# Patient Record
Sex: Male | Born: 1990 | Race: White | Hispanic: No | Marital: Single | Smoking: Never smoker
Health system: Southern US, Community
[De-identification: ages and names within clinical notes are randomized; demographics above are authoritative.]

---

## 2015-07-26 ENCOUNTER — Emergency Department (INDEPENDENT_AMBULATORY_CARE_PROVIDER_SITE_OTHER)
Admission: EM | Admit: 2015-07-26 | Discharge: 2015-07-26 | Disposition: A | Discharge status: Home / Self Care | Payer: BLUE CROSS/BLUE SHIELD | Source: Home / Self Care | Attending: Family Medicine | Admitting: Family Medicine

## 2015-07-26 ENCOUNTER — Encounter: Payer: Self-pay | Admitting: Emergency Medicine

## 2015-07-26 DIAGNOSIS — J069 Acute upper respiratory infection, unspecified: Secondary | ICD-10-CM

## 2015-07-26 DIAGNOSIS — B9789 Other viral agents as the cause of diseases classified elsewhere: Principal | ICD-10-CM

## 2015-07-26 HISTORY — DX: Wilson's disease: E83.01

## 2015-07-26 MED ORDER — PREDNISONE 20 MG PO TABS: 20.0000 mg | ORAL_TABLET | Freq: Two times a day (BID) | ORAL | Status: DC

## 2015-07-26 MED ORDER — AZITHROMYCIN 250 MG PO TABS
ORAL_TABLET | ORAL | Status: DC
Start: 2015-07-26 — End: 2015-08-12

## 2015-07-26 MED ORDER — GUAIFENESIN-CODEINE 100-10 MG/5ML PO SOLN: ORAL | Status: DC

## 2015-07-26 NOTE — ED Notes (Signed)
Patient reports congestion and cough for about a week; wonders if sinus infection since both parents have this. Took Da-quil this morning.

## 2015-07-26 NOTE — Discharge Instructions (Signed)
Take plain guaifenesin (1200mg extended release tabs such as Mucinex) twice daily, with plenty of water, for cough and congestion.   Get adequate rest.   °May use Afrin nasal spray (or generic oxymetazoline) twice daily for about 5 days and then discontinue.  Also recommend using saline nasal spray several times daily and saline nasal irrigation (AYR is a common brand).  Use Flonase nasal spray each morning after using Afrin nasal spray and saline nasal irrigation. °Try warm salt water gargles for sore throat.  °Stop all antihistamines for now, and other non-prescription cough/cold preparations. °Follow-up with family doctor if not improving about 7 to 10 days.  °

## 2015-07-26 NOTE — ED Provider Notes (Signed)
CSN: [id number]     Arrival date & time 07/26/15  1631 History   First MD Initiated Contact with Patient 07/26/15 1710     Chief Complaint  Patient presents with  . Nasal Congestion  . Cough      HPI Comments: About 9 days ago patient developed typical cold-like symptoms developing over several days,  including mild sore throat, sinus congestion, fatigue, and cough.  He states that his non-productive cough has persisted, and he often coughs until he gags.  His cough is worse at night.   Family history of asthma in his mother.   Past Medical History  Diagnosis Date  . 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 No pertinent past surgical history. History reviewed. No pertinent family history. Social History  Substance Use Topics  . Smoking status: Never Smoker   . Smokeless tobacco: None  . Alcohol Use: No    Review of Systems + sore throat + cough No pleuritic pain No wheezing + nasal congestion + post-nasal drainage No sinus pain/pressure No itchy/red eyes No earache No hemoptysis + SOB with activity No fever, + chills/sweats No nausea No vomiting No abdominal pain No diarrhea No urinary symptoms No skin rash + fatigue No myalgias No headache Used OTC meds without relief  Allergies  Review of patient's allergies indicates no known allergies.  Home Medications   Prior to Admission medications   Medication Sig Start Date End Date Taking? Authorizing Provider  amphetamine-dextroamphetamine (ADDERALL XR) 30 MG 24 hr capsule Take 30 mg by mouth daily.   Yes Historical Provider, MD  amphetamine-dextroamphetamine (ADDERALL) 10 MG tablet Take 10 mg by mouth 3 (three) times daily.   Yes Historical Provider, MD  azithromycin (ZITHROMAX Z-PAK) 250 MG tablet Take 2 tabs today; then begin one tab once daily for 4 more days. 07/26/15   Lattie HawStephen A Vikrant Pryce, MD  guaiFENesin-codeine 100-10 MG/5ML syrup Take 10mL by mouth at bedtime as needed for cough 07/26/15    Lattie HawStephen A Priyah Schmuck, MD  predniSONE (DELTASONE) 20 MG tablet Take 1 tablet (20 mg total) by mouth 2 (two) times daily. Take with food. 07/26/15   Lattie HawStephen A Brooklinn Longbottom, MD   Meds Ordered and Administered this Visit  Medications - No data to display  BP 116/79 mmHg  Pulse 98  Temp(Src) 98.1 F (36.7 C) (Oral)  Resp 16  Ht 5' 10.5" (1.791 m)  Wt 240 lb (108.863 kg)  BMI 33.94 kg/m2  SpO2 98% No data found.   Physical Exam Nursing notes and Vital Signs reviewed. Appearance:  Patient appears stated age, and in no acute distress.  Patient is obese (BMI 33.9) Eyes:  Pupils are equal, round, and reactive to light and accomodation.  Extraocular movement is intact.  Conjunctivae are not inflamed  Ears:  Canals normal.  Tympanic membranes normal.  Nose:  Mildly congested turbinates.  No sinus tenderness.   Pharynx:  Normal Neck:  Supple.  Tender enlarged posterior/lateral nodes are palpated bilaterally  Lungs:  Clear to auscultation.  Breath sounds are equal.  Moving air well. Heart:  Regular rate and rhythm without murmurs, rubs, or gallops.  Abdomen:  Nontender without masses or hepatosplenomegaly.  Bowel sounds are present.  No CVA or flank tenderness.  Extremities:  No edema.  Skin:  No rash present.   ED Course  Procedures none  MDM   1. Viral URI with cough     Begin Z-pak for atypical coverage, and prednisone burst. Rx  for Robitussin AC for night time cough.  Take plain guaifenesin (1200mg  extended release tabs such as Mucinex) twice daily, with plenty of water, for cough and congestion.   Get adequate rest.   May use Afrin nasal spray (or generic oxymetazoline) twice daily for about 5 days and then discontinue.  Also recommend using saline nasal spray several times daily and saline nasal irrigation (AYR is a common brand).  Use Flonase nasal spray each morning after using Afrin nasal spray and saline nasal irrigation. Try warm salt water gargles for sore throat.  Stop all antihistamines  for now, and other non-prescription cough/cold preparations.   Follow-up with family doctor if not improving about 7 to10 days.     Lattie HawStephen A Jourdon Zimmerle, MD 08/02/15 [phone]

## 2015-08-12 ENCOUNTER — Emergency Department (INDEPENDENT_AMBULATORY_CARE_PROVIDER_SITE_OTHER): Payer: BLUE CROSS/BLUE SHIELD

## 2015-08-12 ENCOUNTER — Emergency Department (INDEPENDENT_AMBULATORY_CARE_PROVIDER_SITE_OTHER)
Admission: EM | Admit: 2015-08-12 | Discharge: 2015-08-12 | Disposition: A | Discharge status: Home / Self Care | Payer: BLUE CROSS/BLUE SHIELD | Source: Home / Self Care | Attending: Family Medicine | Admitting: Family Medicine

## 2015-08-12 ENCOUNTER — Encounter: Payer: Self-pay | Admitting: *Deleted

## 2015-08-12 DIAGNOSIS — R0789 Other chest pain: Secondary | ICD-10-CM

## 2015-08-12 DIAGNOSIS — R0989 Other specified symptoms and signs involving the circulatory and respiratory systems: Secondary | ICD-10-CM | POA: Diagnosis not present

## 2015-08-12 DIAGNOSIS — R05 Cough: Secondary | ICD-10-CM

## 2015-08-12 DIAGNOSIS — R053 Chronic cough: Secondary | ICD-10-CM

## 2015-08-12 LAB — POCT CBC W AUTO DIFF (K'VILLE URGENT CARE)

## 2015-08-12 MED ORDER — GUAIFENESIN-CODEINE 100-10 MG/5ML PO SOLN: ORAL | Status: DC

## 2015-08-12 MED ORDER — BECLOMETHASONE DIPROPIONATE 40 MCG/ACT IN AERS: 2.0000 | INHALATION_SPRAY | Freq: Two times a day (BID) | RESPIRATORY_TRACT | Status: AC

## 2015-08-12 MED ORDER — CEFDINIR 300 MG PO CAPS: 300.0000 mg | ORAL_CAPSULE | Freq: Two times a day (BID) | ORAL | Status: DC

## 2015-08-12 MED ORDER — PREDNISONE 20 MG PO TABS: ORAL_TABLET | ORAL | Status: DC

## 2015-08-12 MED ORDER — FLUTICASONE PROPIONATE 50 MCG/ACT NA SUSP
NASAL | Status: AC
Start: 2015-08-12 — End: ?

## 2015-08-12 NOTE — ED Provider Notes (Signed)
CSN: [id number]     Arrival date & time 08/12/15  1730 History   First MD Initiated Contact with Patient 08/12/15 1800     Chief Complaint  Patient presents with  . Cough      HPI Comments: Patient was treated for a URI 2.5 weeks ago.  He states that he improved initially while taking prednisone, but his cough recurred after finishing prednisone and azithromycin.  His cough is non-productive and awakens him at night.  No shortness of breath or pleuritic pain.  During the past several days he has had morning sweats but no fever. He states that both his parents have asthma.  The history is provided by the patient.    Past Medical History  Diagnosis Date  . 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 No pertinent past surgical history. Family History  Problem Relation Age of Onset  . Wilson's disease Brother    Social History  Substance Use Topics  . Smoking status: Never Smoker   . Smokeless tobacco: None  . Alcohol Use: No    Review of Systems No sore throat + cough No pleuritic pain No wheezing + nasal congestion + post-nasal drainage No sinus pain/pressure No itchy/red eyes No earache No hemoptysis No SOB No fever, + sweats No nausea No vomiting No abdominal pain No diarrhea No urinary symptoms No skin rash + fatigue No myalgias No headache    Allergies  Review of patient's allergies indicates no known allergies.  Home Medications   Prior to Admission medications   Medication Sig Start Date End Date Taking? Authorizing Provider  amphetamine-dextroamphetamine (ADDERALL XR) 30 MG 24 hr capsule Take 30 mg by mouth daily.    Historical Provider, MD  amphetamine-dextroamphetamine (ADDERALL) 10 MG tablet Take 10 mg by mouth 3 (three) times daily.    Historical Provider, MD   Meds Ordered and Administered this Visit  Medications - No data to display  BP 148/87 mmHg  Pulse 100  Temp(Src) 98.1 F (36.7 C) (Oral)  Resp 18  Ht   (1.778 m)  Wt 240 lb (108.863 kg)  BMI 34.44 kg/m2  SpO2 98% No data found.   Physical Exam Nursing notes and Vital Signs reviewed. Appearance:  Patient appears stated age, and in no acute distress Eyes:  Pupils are equal, round, and reactive to light and accomodation.  Extraocular movement is intact.  Conjunctivae are not inflamed  Ears:  Canals normal.  Tympanic membranes normal.  Nose:  Congested turbinates.  No sinus tenderness.   Pharynx:  Normal Neck:  Supple.  Tender enlarged posterior/lateral nodes are palpated bilaterally  Lungs:  Clear to auscultation.  Breath sounds are equal.  Moving air well. Heart:  Regular rate and rhythm without murmurs, rubs, or gallops.  Abdomen:  Nontender without masses or hepatosplenomegaly.  Bowel sounds are present.  No CVA or flank tenderness.  Extremities:  No edema.  Skin:  No rash present.   ED Course  Procedures none    Labs Reviewed  POCT CBC W AUTO DIFF ([facility]):  WBC 7.2; LY 37.2; MO 4.7; GR 58.1; Hgb 15.6; Platelets 288     Imaging Review Dg Chest 2 View  08/12/2015  CLINICAL DATA:  Persistent dry cough with congestion and chest tightness. EXAM: CHEST  2 VIEW COMPARISON:  None. FINDINGS: The heart size and mediastinal contours are within normal limits. Both lungs are clear. The [id number] skeletal structures are unremarkable. IMPRESSION: No active cardiopulmonary  disease. Electronically Signed   By: Kennith CenterEric  Mansell M.D.   On: 08/12/2015 18:34      MDM   1. Persistent cough for 3 weeks or longer; suspect reactive airways disease    Begin prednisone burst/taper and controller med QVAR 2 puffs BID Normal WBC reassuring; however patient has had morning sweats recently.  Will begin empiric cefdinir.  Take plain guaifenesin (1200mg  extended release tabs such as Mucinex) twice daily, with plenty of water, for cough and congestion.   Get adequate rest.     Also recommend using saline nasal spray several times daily and saline  nasal irrigation (AYR is a common brand).  Use Flonase nasal spray each morning after using Afrin nasal spray and saline nasal irrigation. Try warm salt water gargles for sore throat.    Follow-up with pulmonologist if not improving about10 days.     Lattie HawStephen A Larone Kliethermes, MD 08/13/15 [phone]

## 2015-08-12 NOTE — ED Notes (Signed)
Pt c/o nonproductive cough x 1 wk. Denies fever.  

## 2015-08-12 NOTE — Discharge Instructions (Signed)
Take plain guaifenesin (1200mg  extended release tabs such as Mucinex) twice daily, with plenty of water, for cough and congestion.   Get adequate rest.     Also recommend using saline nasal spray several times daily and saline nasal irrigation (AYR is a common brand).  Use Flonase nasal spray each morning after using Afrin nasal spray and saline nasal irrigation. Try warm salt water gargles for sore throat.    Follow-up with pulmonologist if not improving about10 days.

## 2015-10-19 ENCOUNTER — Emergency Department (INDEPENDENT_AMBULATORY_CARE_PROVIDER_SITE_OTHER)
Admission: EM | Admit: 2015-10-19 | Discharge: 2015-10-19 | Disposition: A | Discharge status: Home / Self Care | Payer: BLUE CROSS/BLUE SHIELD | Source: Home / Self Care | Attending: Family Medicine | Admitting: Family Medicine

## 2015-10-19 ENCOUNTER — Emergency Department (INDEPENDENT_AMBULATORY_CARE_PROVIDER_SITE_OTHER): Payer: BLUE CROSS/BLUE SHIELD

## 2015-10-19 DIAGNOSIS — R937 Abnormal findings on diagnostic imaging of other parts of musculoskeletal system: Secondary | ICD-10-CM

## 2015-10-19 DIAGNOSIS — M25461 Effusion, right knee: Secondary | ICD-10-CM | POA: Diagnosis not present

## 2015-10-19 DIAGNOSIS — M25561 Pain in right knee: Secondary | ICD-10-CM | POA: Diagnosis not present

## 2015-10-19 MED ORDER — INDOMETHACIN 25 MG PO CAPS: 25.0000 mg | ORAL_CAPSULE | Freq: Three times a day (TID) | ORAL | 0 refills | Status: DC | PRN

## 2015-10-19 MED ORDER — HYDROCODONE-ACETAMINOPHEN 5-325 MG PO TABS: 1.0000 | ORAL_TABLET | Freq: Four times a day (QID) | ORAL | 0 refills | Status: DC | PRN

## 2015-10-19 MED ORDER — TRAMADOL HCL 50 MG PO TABS: 50.0000 mg | ORAL_TABLET | Freq: Four times a day (QID) | ORAL | 0 refills | Status: DC | PRN

## 2015-10-19 NOTE — ED Provider Notes (Signed)
CSN: [id number]     Arrival date & time 10/19/15  1557 History   First MD Initiated Contact with Patient 10/19/15 1648     Chief Complaint  Patient presents with  . Knee Pain   (Consider location/radiation/quality/duration/timing/severity/associated sxs/prior Treatment) HPI Vincent Lawrence is a 25 y.o. male presenting to UC with c/o sudden onset, suddenly worsening Right knee pain that woke him from his sleep around 4AM this morning.  Pain was so severe this morning he states he was sobbing in pain.  Pt states he initially started to have pain in Right knee about 1 week ago.  Pain is worse to lateral aspect and over kneecap, worse with standing, movement and palpation.  Denies known injury.  Pain is currently throbbing, 9/10. No known injury. No hx of gout. Denies fever or chills.    Past Medical History:  Diagnosis Date  . Heterozygous Wilson's disease    carrier   History reviewed. No pertinent surgical history. Family History  Problem Relation Age of Onset  . Wilson's disease Brother   . Hypertension Father    Social History  Substance Use Topics  . Smoking status: Never Smoker  . Smokeless tobacco: Not on file  . Alcohol use No    Review of Systems  Musculoskeletal: Positive for arthralgias and myalgias. Negative for gait problem and joint swelling.  Skin: Negative for color change, rash and wound.  Neurological: Negative for weakness and numbness.    Allergies  Review of patient's allergies indicates no known allergies.  Home Medications   Prior to Admission medications   Medication Sig Start Date End Date Taking? Authorizing Provider  amphetamine-dextroamphetamine (ADDERALL XR) 30 MG 24 hr capsule Take 30 mg by mouth daily.    Historical Provider, MD  amphetamine-dextroamphetamine (ADDERALL) 10 MG tablet Take 10 mg by mouth 3 (three) times daily.    Historical Provider, MD  beclomethasone (QVAR) 40 MCG/ACT inhaler Inhale 2 puffs into the lungs 2 (two) times daily.  08/12/15   Lattie HawStephen A Beese, MD  cefdinir (OMNICEF) 300 MG capsule Take 1 capsule (300 mg total) by mouth 2 (two) times daily. 08/12/15   Lattie HawStephen A Beese, MD  fluticasone Aleda Grana(FLONASE) 50 MCG/ACT nasal spray Place two sprays in each nostril once daily 08/12/15   Lattie HawStephen A Beese, MD  guaiFENesin-codeine 100-10 MG/5ML syrup Take 10mL by mouth at bedtime as needed for cough 08/12/15   Lattie HawStephen A Beese, MD  HYDROcodone-acetaminophen (NORCO/VICODIN) 5-325 MG tablet Take 1-2 tablets by mouth every 6 (six) hours as needed for severe pain. 10/19/15   Junius FinnerErin O'Malley, PA-C  indomethacin (INDOCIN) 25 MG capsule Take 1 capsule (25 mg total) by mouth 3 (three) times daily as needed. 10/19/15   Junius FinnerErin O'Malley, PA-C  predniSONE (DELTASONE) 20 MG tablet Take one tab by mouth twice daily for 5 days, then one daily. Take with food. 08/12/15   Lattie HawStephen A Beese, MD   Meds Ordered and Administered this Visit  Medications - No data to display  BP 145/90 (BP Location: Left Arm)   Pulse 109   Temp 98 F (36.7 C) (Oral)   Ht 5\' 10"  (1.778 m)   Wt 258 lb 1.9 oz (117.1 kg)   SpO2 98%   BMI 37.04 kg/m  No data found.   Physical Exam  Constitutional: He is oriented to person, place, and time. He appears well-developed and well-nourished.  HENT:  Head: Normocephalic and atraumatic.  Eyes: EOM are normal.  Neck: Normal range of motion.  Cardiovascular: Normal  rate.   Pulmonary/Chest: Effort normal.  Musculoskeletal: Normal range of motion. He exhibits edema and tenderness.  Right knee: Mild edema, tenderness to lateral aspect and over patella. No medial or posterior pain.  No crepitus.  Increased pain with flexion and extension.  Calf is soft, non-tender.  Neurological: He is alert and oriented to person, place, and time.  Skin: Skin is warm and dry. No rash noted. There is erythema.  Right knee, anterior aspect: skin in tact. Minimal amount of dried erythematous skin. No warmth. No fluctuance.   Psychiatric: He has a normal  mood and affect. His behavior is normal.  Nursing note and vitals reviewed.   Urgent Care Course   Clinical Course    Procedures (including critical care time)  Labs Review Labs Reviewed  CBC WITH DIFFERENTIAL/PLATELET   Narrative:    Performed at:  [facility]                [address], [address]                [place], [place] [address]  URIC ACID   Narrative:    Performed at:  [facility]                [address], [address]                [place], [place] [address]    Imaging Review Dg Knee Complete 4 Views Right  Result Date: 10/19/2015 CLINICAL DATA:  Right knee pain for 1 week, no known injury EXAM: RIGHT KNEE - COMPLETE 4+ VIEW COMPARISON:  None. FINDINGS: Four views of the right knee submitted. No acute fracture or subluxation. Mild narrowing of medial joint compartment. Mild prepatellar soft tissue swelling. No joint effusion. IMPRESSION: No acute fracture or subluxation. Mild narrowing of medial joint compartment. Mild prepatellar soft tissue swelling. Electronically Signed   By: Natasha Mead M.D.   On: 10/19/2015 18:02     MDM   1. Right knee pain    Pt c/o sudden onset Right knee pain w/o known injury and no prior hx of gout.   No evidence of septic knee at this time.    CBC and Uric acid levels ordered. Pt given knee sleeve for comfort.  Pt has crutches at home. Advised to f/u with Dr. Benjamin Stain, Sports Medicine tomorrow for further evaluation, however, pt will be out of town for 2 days. Agreeable to f/u when he returns.   Rx: norco and indomethacin (possible gout)  Home care instructions provided.       Junius Finner, PA-C 10/20/15 1117

## 2015-10-19 NOTE — ED Triage Notes (Signed)
Pt started with right knee pain about a week ago.  It has progressively become worse.  The pain is on the lateral aspects of knee cap.  This morning at 4 am it woke him up with severe pain to the point he was sobbing.  Denies injury, denies bruising and swelling.

## 2015-10-19 NOTE — Discharge Instructions (Signed)
°  Norco/Vicodin (hydrocodone-acetaminophen) is a narcotic pain medication, do not combine these medications with others containing tylenol. While taking, do not drink alcohol, drive, or perform any other activities that requires focus while taking these medications.   Indomethacin (Indocin) is an antiinflammatory to help with pain and inflammation.  Do not take ibuprofen, Advil, Aleve, or any other medications that contain NSAIDs while taking meloxicam as this may cause stomach upset or even ulcers if taken in large amounts for an extended period of time.

## 2015-10-20 LAB — CBC WITH DIFFERENTIAL/PLATELET
Basophils Absolute: 0 cells/uL (ref 0–200)
Basophils Relative: 0 %
Eosinophils Absolute: 231 cells/uL (ref 15–500)
Eosinophils Relative: 3 %
HCT: 43.7 % (ref 38.5–50.0)
Hemoglobin: 14.7 g/dL (ref 13.2–17.1)
Lymphocytes Relative: 25 %
Lymphs Abs: 1925 cells/uL (ref 850–3900)
MCH: 28.4 pg (ref 27.0–33.0)
MCHC: 33.6 g/dL (ref 32.0–36.0)
MCV: 84.5 fL (ref 80.0–100.0)
MPV: 9.2 fL (ref 7.5–12.5)
Monocytes Absolute: 462 cells/uL (ref 200–950)
Monocytes Relative: 6 %
Neutro Abs: 5082 cells/uL (ref 1500–7800)
Neutrophils Relative %: 66 %
Platelets: 272 10*3/uL (ref 140–400)
RBC: 5.17 MIL/uL (ref 4.20–5.80)
RDW: 13.8 % (ref 11.0–15.0)
WBC: 7.7 10*3/uL (ref 3.8–10.8)

## 2015-10-20 LAB — URIC ACID: Uric Acid, Serum: 7.4 mg/dL (ref 4.0–8.0)

## 2015-10-21 ENCOUNTER — Telehealth: Payer: Self-pay

## 2015-10-21 NOTE — Telephone Encounter (Signed)
Called patient, feeling better with the medication prescribed, but still having knee pain.  Told labs were normal, and pt is going to follow up with Dr. TKarie Schwalbe

## 2015-10-29 ENCOUNTER — Ambulatory Visit (INDEPENDENT_AMBULATORY_CARE_PROVIDER_SITE_OTHER): Payer: BLUE CROSS/BLUE SHIELD | Admitting: Sports Medicine

## 2015-10-29 DIAGNOSIS — M7651 Patellar tendinitis, right knee: Secondary | ICD-10-CM | POA: Diagnosis not present

## 2015-10-29 MED ORDER — MELOXICAM 15 MG PO TABS: ORAL_TABLET | ORAL | 3 refills | Status: AC

## 2015-10-29 NOTE — Progress Notes (Signed)
   Subjective:    I'm seeing this patient as a consultation for:  Junius FinnerErin O'Malley PA-C  CC: Right knee pain  HPI: This is a pleasant 25 year old male new onset acute right knee pain, localized at the base of the patella, severe, persistent without radiation, no trauma, no change in physical activity.  Past medical history:  Negative.  See flowsheet/record as well for more information.  Surgical history: Negative.  See flowsheet/record as well for more information.  Family history: Negative.  See flowsheet/record as well for more information.  Social history: Negative.  See flowsheet/record as well for more information.  Allergies, and medications have been entered into the medical record, reviewed, and no changes needed.   Review of Systems: No headache, visual changes, nausea, vomiting, diarrhea, constipation, dizziness, abdominal pain, skin rash, fevers, chills, night sweats, weight loss, swollen lymph nodes, body aches, joint swelling, muscle aches, chest pain, shortness of breath, mood changes, visual or auditory hallucinations.   Objective:   General: Well Developed, well nourished, and in no acute distress.  Neuro/Psych: Alert and oriented x3, extra-ocular muscles intact, able to move all 4 extremities, sensation grossly intact. Skin: Warm and dry, no rashes noted.  Respiratory: Not using accessory muscles, speaking in full sentences, trachea midline.  Cardiovascular: Pulses palpable, no extremity edema. Abdomen: Does not appear distended. Right Knee: Normal to inspection with no erythema or effusion or obvious bony abnormalities. Palpation normal with no warmth or joint line tenderness or patellar tenderness or condyle tenderness. ROM normal in flexion and extension and lower leg rotation. Ligaments with solid consistent endpoints including ACL, PCL, LCL, MCL. Negative Mcmurray's and provocative meniscal tests. Non painful patellar compression. Tender to palpation at the inferior  pole of the patella at the patellar tendon origin. Hamstring and quadriceps strength is normal.  Impression and Recommendations:   This case required medical decision making of moderate complexity.  Patellar tendinitis of right knee Classic patellar tendinitis. Adding a Cho-Pat strap, meloxicam, formal physical therapy. No evidence of joint effusion to suggest crystalline arthropathy.

## 2015-10-29 NOTE — Assessment & Plan Note (Signed)
Classic patellar tendinitis. Adding a Cho-Pat strap, meloxicam, formal physical therapy. No evidence of joint effusion to suggest crystalline arthropathy.

## 2015-11-10 ENCOUNTER — Encounter: Payer: Self-pay | Admitting: Rehabilitative and Restorative Service Providers"

## 2015-11-10 ENCOUNTER — Ambulatory Visit (INDEPENDENT_AMBULATORY_CARE_PROVIDER_SITE_OTHER): Payer: BLUE CROSS/BLUE SHIELD | Admitting: Rehabilitative and Restorative Service Providers"

## 2015-11-10 DIAGNOSIS — R29898 Other symptoms and signs involving the musculoskeletal system: Secondary | ICD-10-CM | POA: Diagnosis not present

## 2015-11-10 DIAGNOSIS — M25561 Pain in right knee: Secondary | ICD-10-CM | POA: Diagnosis not present

## 2015-11-10 NOTE — Therapy (Addendum)
[place], [place], [address] Phone: [phone]   Fax:  [phone]  Physical Therapy Evaluation  Patient Details  Name: Vincent Lawrence MRN: [id number] Date of Birth: 1990/03/08 Referring Provider: Dr Dianah Field  Encounter Date: 11/10/2015      PT End of Session - 11/10/15 1253    Visit Number 1   Number of Visits 12   Date for PT Re-Evaluation 12/22/15   PT Start Time 1100   PT Stop Time 1158   PT Time Calculation (min) 58 min   Activity Tolerance Patient tolerated treatment well      Past Medical History:  Diagnosis Date  . 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 No pertinent surgical history.  There were no vitals filed for this visit.       Subjective Assessment - 11/10/15 1101    Subjective Vincent Lawrence reports that he has had Rt knee pain over the course of the past 4 weeks. Noticed pain soreness in the patella and then awoke with pain in the middle of the night. He was seen in urgent care with Xrays (-). Seen by MD and diagnosed with patellar tendonitis. He was started on meds with minimal change. He is in a chopat brace which has helped "a little bit" at work. Continues to have pain in the Rt knee.     Pertinent History Denies any medical problems - bilat ankle sprains in remote past.    How long can you sit comfortably? aching with any sitting    How long can you stand comfortably? aching with any standing - worse than sitting can stand ~ 10 min    How long can you walk comfortably? 5-10 min    Diagnostic tests xrays (-)   Patient Stated Goals get rid of pain and keep it from coming back    Currently in Pain? Yes   Pain Score 5    Pain Location Knee   Pain Orientation Right   Pain Descriptors / Indicators Aching  pulses    Pain Type Acute pain   Pain Onset More than a month ago   Pain Frequency Constant   Aggravating Factors  bending knee; squatting;  walking; stairs down more than up    Pain Relieving Factors chopat brace; biofreeze; lying down with LE elevated            [facility] PT Assessment - 11/10/15 0001      Assessment   Medical Diagnosis Rt patellar tendinitis   Referring Provider Dr Dianah Field   Onset Date/Surgical Date 10/11/15   Hand Dominance Right   Next MD Visit 11/28/15   Prior Therapy none     Precautions   Precautions None     Balance Screen   Has the patient fallen in the past 6 months No   Has the patient had a decrease in activity level because of a fear of falling?  No   Is the patient reluctant to leave their home because of a fear of falling?  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 - ~ 14 months    Leisure household activiteis; yard work at times; works on [employer] with dad  Observation/Other Assessments   Focus on Therapeutic Outcomes (FOTO)  66% limitation      Observation/Other Assessments-Edema    Edema --  moderate edema peri-patellar area Rt knee      Sensation   Additional Comments WNL's per pt report      Posture/Postural Control   Posture Comments head forward; increased thoracic kyphosis; Rt knee hyperextended; LE's in ER      AROM   Overall AROM Comments Rt knee flexion 123 painful; Lt 131      Strength   Overall Strength Comments 5/5 hips bilat; Lt knee 5/5; Rt knee flex 5/5 ext 5-/5 painful      Flexibility   Hamstrings 80 - 85 deg    Quadriceps tight Rt > Lt minimal   ITB WFL's   Piriformis WFL's      Palpation   Patella mobility WFL's except inferior glide    Palpation comment pain peripatellar area through quads; quad tendon Rt; tightness noted through Rt quad      Ambulation/Gait   Pre-Gait  Activities Heel raises x 10 Lt with min UE assist; Rt with max assist of UE      Balance   Balance Assessed --  SLS Lt 10 sec; Rt 10 sec with assist of UE                    [facility] Adult PT Treatment/Exercise - 11/10/15 0001      Therapeutic Activites    Therapeutic Activities --  myofacial release work Rt quad using the stick      Knee/Hip Exercises: Stretches   Passive Hamstring Stretch 2 reps;30 seconds   Quad Stretch 3 reps;30 seconds   Quad Stretch Limitations foam roll under distal thigh to increase quad stretch    ITB Stretch 2 reps;30 seconds     Knee/Hip Exercises: Supine   Quad Sets Right;10 reps  10 sec hold    Quad Sets Limitations towel under knee to prevent hyperextension    Straight Leg Raises Strengthening;Right;10 reps  3-5 sec hold - some discomfort at patella      Acupuncturist Location Rt knee    Electrical Stimulation Action ion repelling    Electrical Stimulation Parameters to tolerance   Electrical Stimulation Goals Pain;Edema     Vasopneumatic   Number Minutes Vasopneumatic  15 minutes   Vasopnuematic Location  Knee  Rt   Vasopneumatic Pressure Medium   Vasopneumatic Temperature  3*                PT Education - 11/10/15 1135    Education provided Yes   Education Details HEP; TENS    Person(s) Educated Patient   Methods Explanation;Demonstration;Tactile cues;Verbal cues;Handout   Comprehension Verbalized understanding;Returned demonstration;Verbal cues required;Tactile cues required             PT Long Term Goals - 11/10/15 1258      PT LONG TERM GOAL #1   Title AROM Rt knee flexion equal to Lt 12/22/15   Time 6   Period Weeks   Status New     PT LONG TERM GOAL #2   Title Painfree Rt knee functional activities including sitting; walking; squatting 12/22/15   Time 6   Period Weeks   Status New     PT LONG TERM GOAL #3   Title Independent in HEP 12/22/15   Time 6   Period  Weeks   Status New  PT LONG TERM GOAL #4   Title Improve FOTO to </= 38% limitation 12/22/15   Time 6   Period Weeks   Status New               Plan - 11/10/15 1254    Clinical Impression Statement Patient presents with signs and symptoms consistent with patellar tendinitis. He has mild to moderate edema Rt patellar area; decreased Rt knee flexion compared to Lt; muscular tightness Rt LE; tenderness and tightness to palpation in patella and quad tendon/quad belly; pain with resistive Rt knee extension testing and with functional activities.    Rehab Potential Good   PT Frequency 2x / week   PT Duration 6 weeks   PT Treatment/Interventions Patient/family education;ADLs/Self Care Home Management;Cryotherapy;Electrical Stimulation;Iontophoresis 61m/ml Dexamethasone;Moist Heat;Ultrasound;Dry needling;Manual techniques;Therapeutic activities;Therapeutic exercise;Neuromuscular re-education   PT Next Visit Plan progress with ther ex; weight bearing activities for functional strengthening; modalities for edema and pain management; taping for patellar alignment and decompression as indicated    Consulted and Agree with Plan of Care Patient      Patient will benefit from skilled therapeutic intervention in order to improve the following deficits and impairments:  Improper body mechanics, Pain, Decreased strength, Decreased range of motion, Increased fascial restricitons, Decreased activity tolerance  Visit Diagnosis: Acute pain of right knee - Plan: PT plan of care cert/re-cert  Other symptoms and signs involving the musculoskeletal system - Plan: PT plan of care cert/re-cert     Problem List Patient Active Problem List   Diagnosis Date Noted  . Patellar tendinitis of right knee 10/29/2015    Daundre Biel PNilda SimmerPT, MPH  11/10/2015, 1:03 PM  [facility] [address]Phone: [phone]  Fax:   [phone] Name: Vincent TrickeyMRN: [id number] of Birth: [birth year] PHYSICAL THERAPY DISCHARGE SUMMARY  Visits from Start of Care: Evaluation only  Current functional level related to goals / functional outcomes: Patient was seen for evaluation only   Remaining deficits: Unchanged    Education / Equipment: Initial HEP Plan: Patient agrees to discharge.  Patient goals were not met. Patient is being discharged due to not returning since the last visit.  ?????     Almadelia Looman P. HHelene KelpPT, MPH 12/29/15 12:02 PM

## 2015-11-10 NOTE — Patient Instructions (Addendum)
HIP: Hamstrings - Supine   Place strap around foot. Raise leg up, keeping knee straight.  Bend opposite knee to protect back if indicated. Hold 30 seconds. 3 reps per set, 2-3 sets per day     Outer Hip Stretch: Reclined IT Band Stretch (Strap)   Strap around one foot, pull leg across body until you feel a pull or stretch, with shoulders on mat. Hold for 30 seconds. Repeat 3 times each leg. 2-3 times/day.  Quads / HF, Prone   Lie face down. Grasp one ankle with same-side hand. Use towel if needed to reach. Gently pull foot toward buttock.  Hold 30 seconds. Repeat 3 times per session. Do 2-3 sessions per day.   Quad Sets    Slowly tighten thigh muscles of straight right leg while counting out loud to __10 sec__. Relax. Repeat __10__ times. Do _2-3___ sessions per day.    Strengthening: Straight Leg Raise (Phase 1)    Tighten muscles on front of right thigh, then lift leg __10__ inches from surface, keeping knee locked.  Hold 5 sec Repeat __10_ times per set. Do _1-3___ sets per session. Do __1-2_ sessions per day.   Massage to quad with "the stick" or rolling pin or ball   Ice   TENS UNIT: This is helpful for muscle pain and spasm.   Search and Purchase a TENS 7000 2nd edition at www.tenspros.com. It should be less than $30.     TENS unit instructions: Do not shower or bathe with the unit on Turn the unit off before removing electrodes or batteries If the electrodes lose stickiness add a drop of water to the electrodes after they are disconnected from the unit and place on plastic sheet. If you continued to have difficulty, call the TENS unit company to purchase more electrodes. Do not apply lotion on the skin area prior to use. Make sure the skin is clean and dry as this will help prolong the life of the electrodes. After use, always check skin for unusual red areas, rash or other skin difficulties. If there are any skin problems, does not apply electrodes to  the same area. Never remove the electrodes from the unit by pulling the wires. Do not use the TENS unit or electrodes other than as directed. Do not change electrode placement without consultating your therapist or physician. Keep 2 fingers with between each electrode.

## 2015-11-24 ENCOUNTER — Encounter: Payer: BLUE CROSS/BLUE SHIELD | Admitting: Rehabilitative and Restorative Service Providers"

## 2015-12-22 ENCOUNTER — Encounter: Payer: BLUE CROSS/BLUE SHIELD | Admitting: Rehabilitative and Restorative Service Providers"

## 2018-08-28 IMAGING — DX DG KNEE COMPLETE 4+V*R*
4 series · 4 of 4 positions shown · non-contrast
Comparison: None.

CLINICAL DATA: Right knee pain for 1 week, no known injury

EXAM:
RIGHT KNEE - COMPLETE 4+ VIEW

[knee ap]
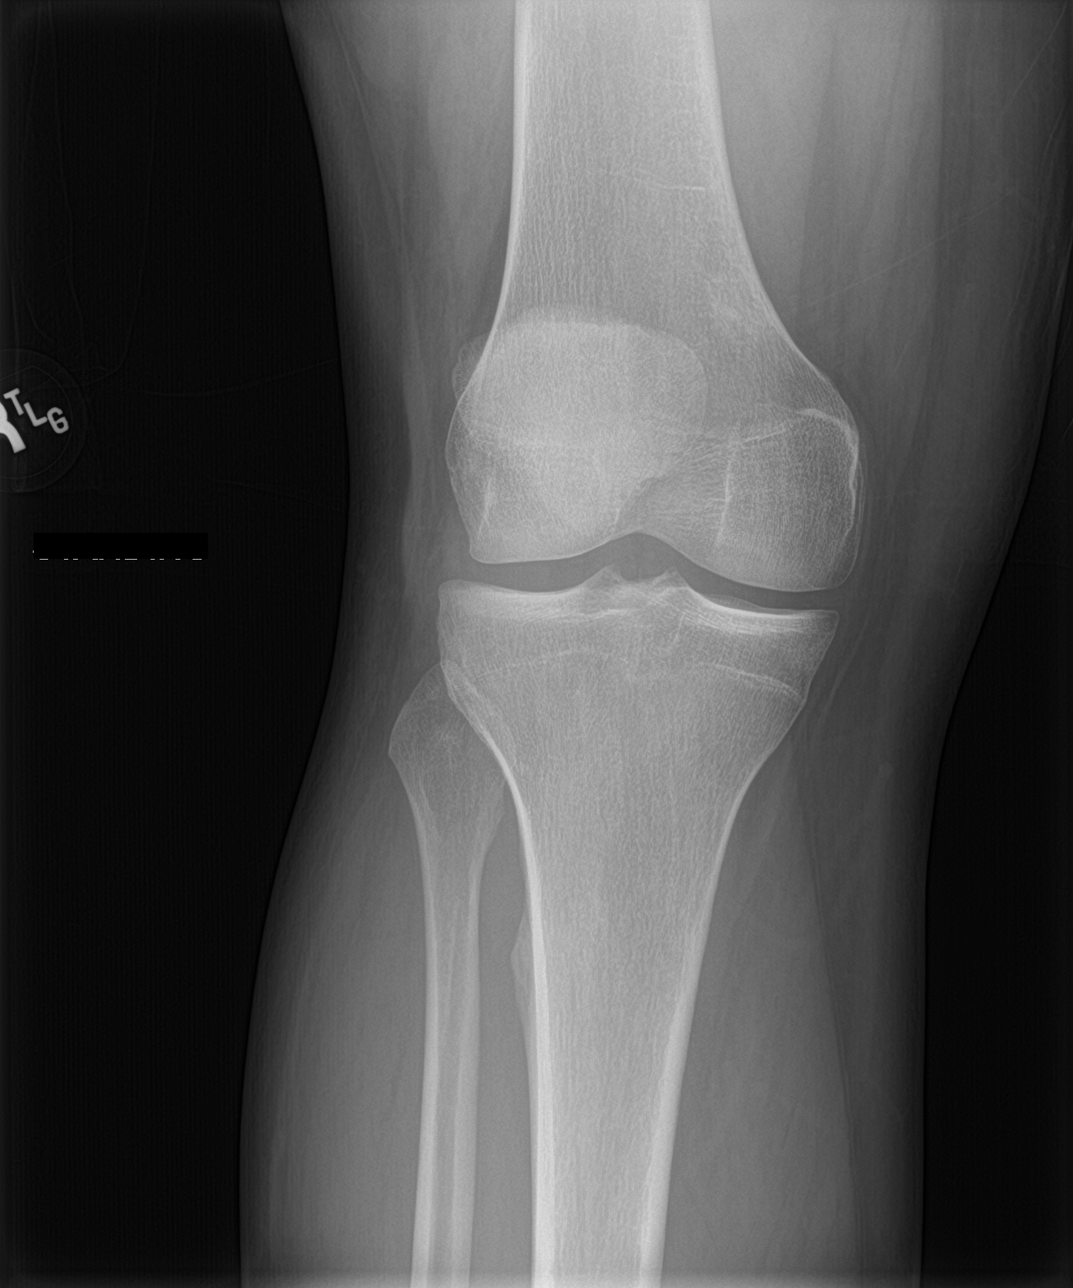

[tunnel]
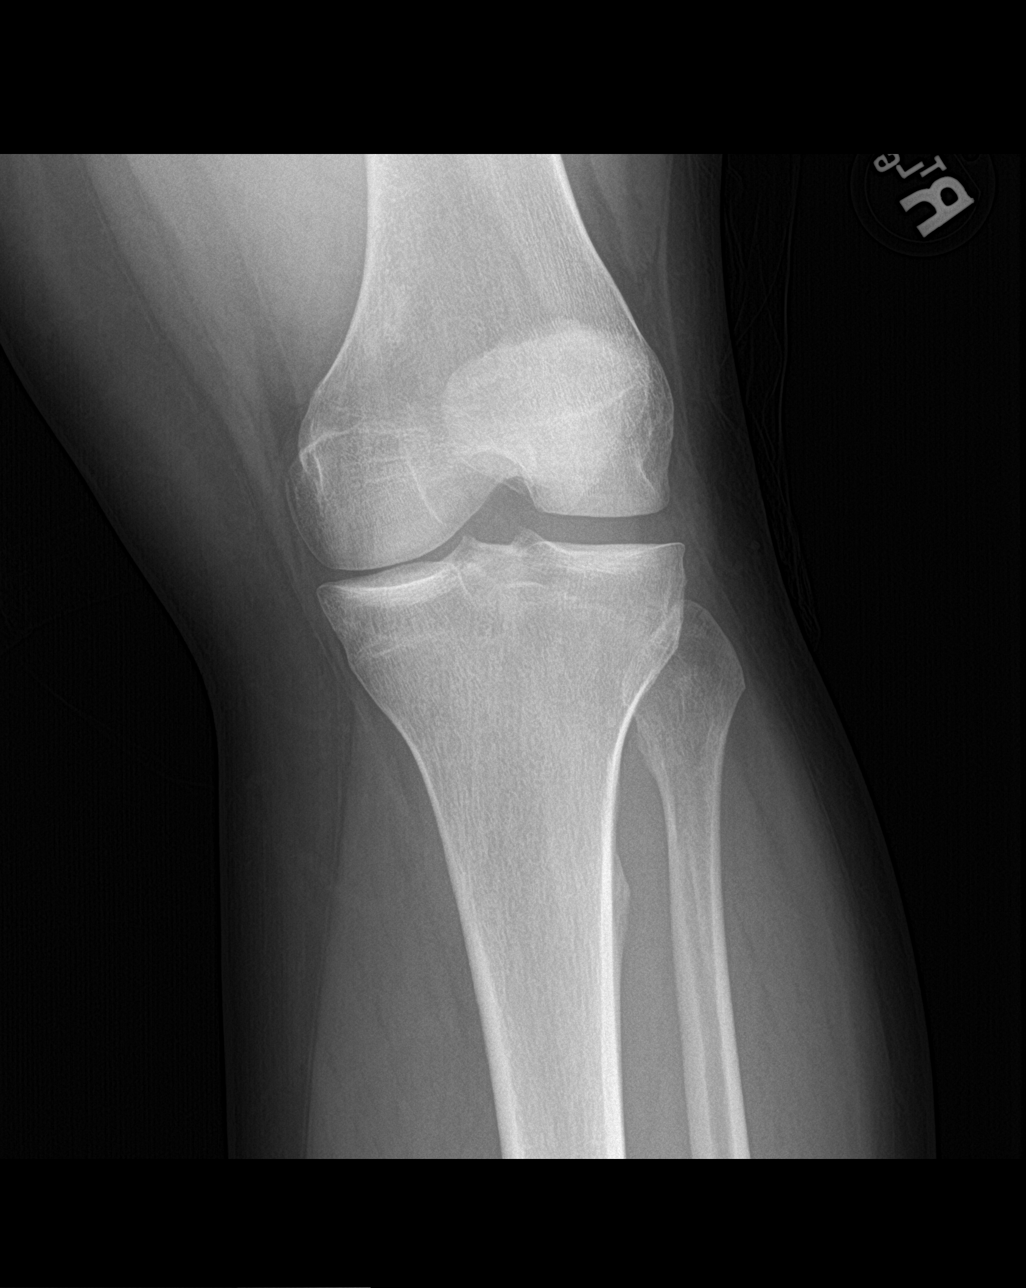

[knee lat]
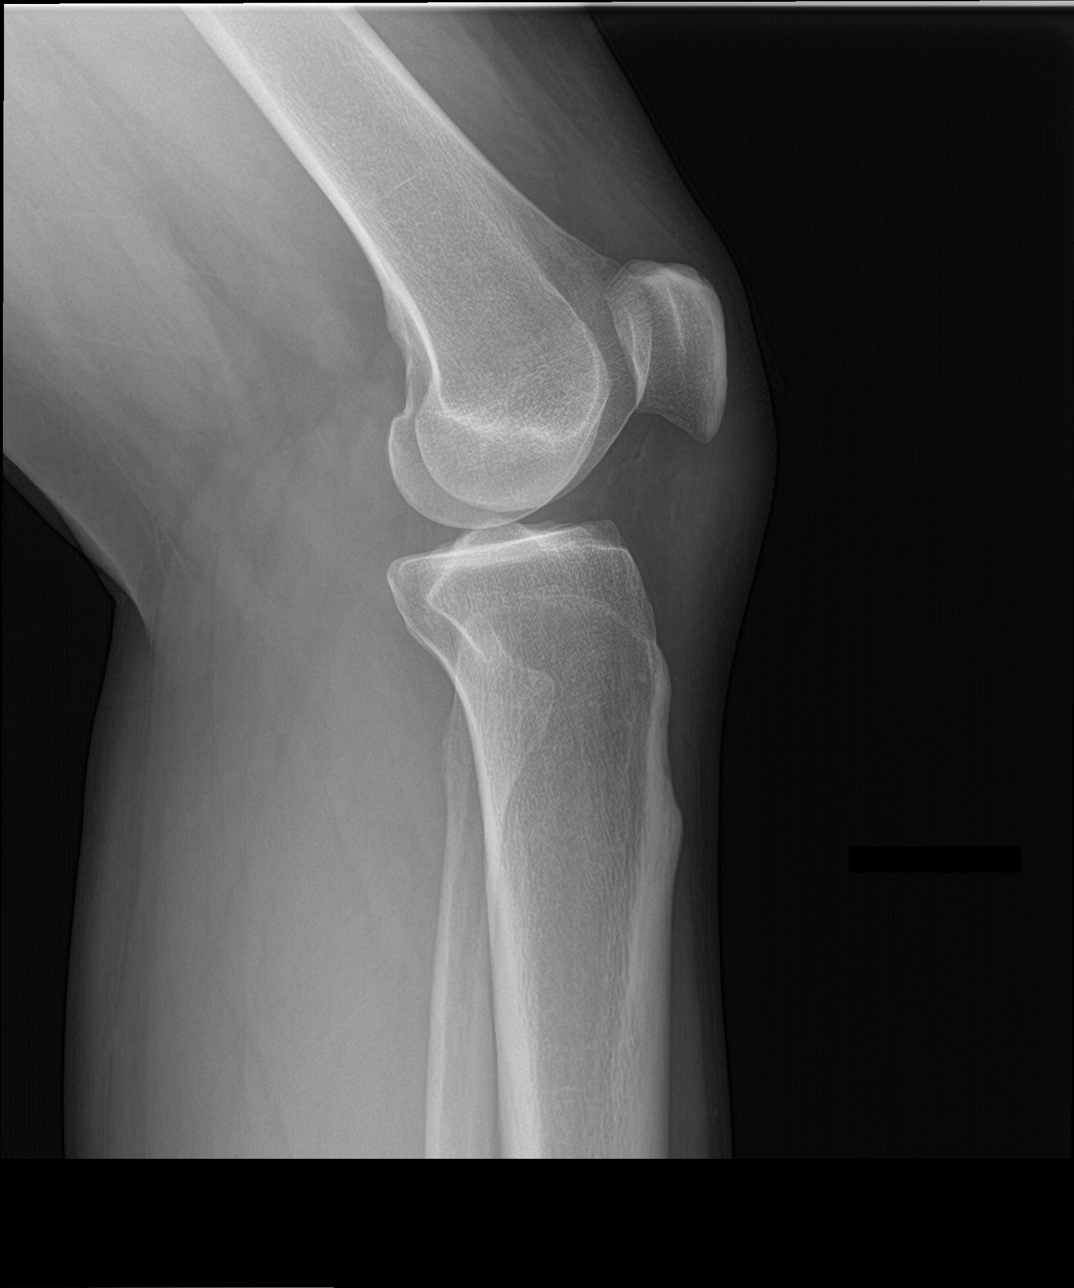

[knee sunrise]
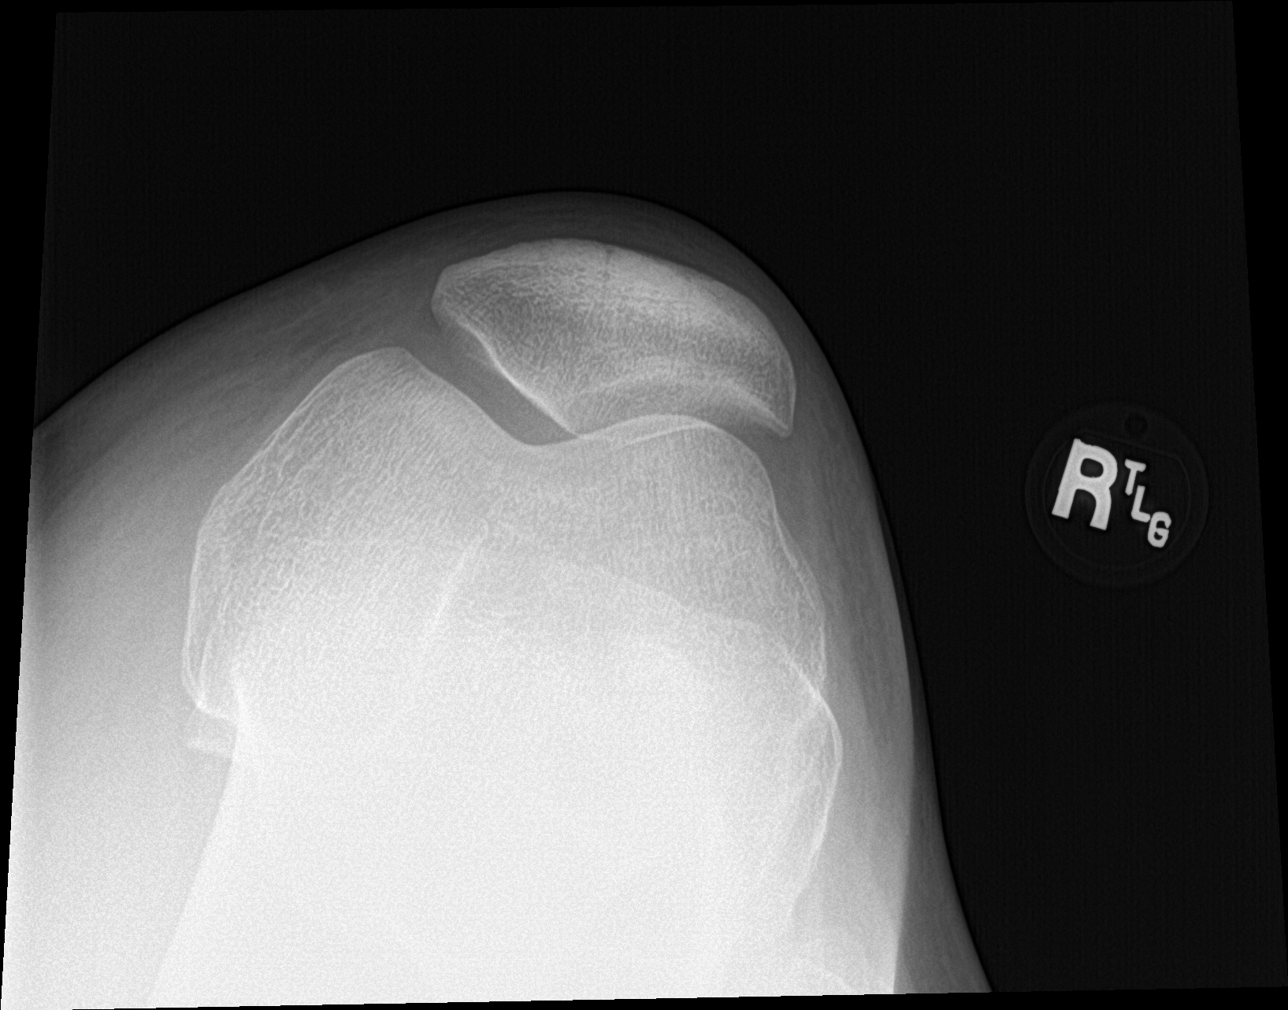

[4 of 4 positions shown; findings below may reference images not displayed]

FINDINGS: Four views of the right knee submitted. No acute fracture or
subluxation. Mild narrowing of medial joint compartment. Mild
prepatellar soft tissue swelling. No joint effusion.
IMPRESSION: No acute fracture or subluxation. Mild narrowing of medial joint
compartment. Mild prepatellar soft tissue swelling.
